# Patient Record
Sex: Female | Born: 1985 | Hispanic: No | Marital: Single | Smoking: Never smoker
Health system: Southern US, Community
[De-identification: ages and names within clinical notes are randomized; demographics above are authoritative.]

## PROBLEM LIST (undated history)

## (undated) DIAGNOSIS — D649 Anemia, unspecified: Secondary | ICD-10-CM

## (undated) DIAGNOSIS — B019 Varicella without complication: Secondary | ICD-10-CM

## (undated) DIAGNOSIS — J45909 Unspecified asthma, uncomplicated: Secondary | ICD-10-CM

## (undated) HISTORY — DX: Unspecified asthma, uncomplicated: J45.909

## (undated) HISTORY — DX: Varicella without complication: B01.9

## (undated) HISTORY — PX: NO PAST SURGERIES: SHX2092

---

## 2015-10-13 ENCOUNTER — Other Ambulatory Visit (HOSPITAL_COMMUNITY)
Admission: RE | Admit: 2015-10-13 | Discharge: 2015-10-13 | Disposition: A | Discharge status: Home / Self Care | Payer: BLUE CROSS/BLUE SHIELD | Source: Ambulatory Visit | Attending: Family Medicine | Admitting: Family Medicine

## 2015-10-13 ENCOUNTER — Ambulatory Visit: Payer: BLUE CROSS/BLUE SHIELD | Admitting: Nurse Practitioner

## 2015-10-13 ENCOUNTER — Ambulatory Visit (INDEPENDENT_AMBULATORY_CARE_PROVIDER_SITE_OTHER): Payer: BLUE CROSS/BLUE SHIELD | Admitting: Family Medicine

## 2015-10-13 ENCOUNTER — Encounter: Payer: Self-pay | Admitting: Family Medicine

## 2015-10-13 VITALS — BP 110/68 | HR 75 | Temp 98.6°F | Ht 66.0 in | Wt 189.6 lb

## 2015-10-13 DIAGNOSIS — Z01419 Encounter for gynecological examination (general) (routine) without abnormal findings: Secondary | ICD-10-CM | POA: Insufficient documentation

## 2015-10-13 DIAGNOSIS — R6889 Other general symptoms and signs: Secondary | ICD-10-CM

## 2015-10-13 DIAGNOSIS — Z13220 Encounter for screening for lipoid disorders: Secondary | ICD-10-CM | POA: Diagnosis not present

## 2015-10-13 DIAGNOSIS — Z124 Encounter for screening for malignant neoplasm of cervix: Secondary | ICD-10-CM

## 2015-10-13 DIAGNOSIS — Z0000 Encounter for general adult medical examination without abnormal findings: Secondary | ICD-10-CM

## 2015-10-13 DIAGNOSIS — M7552 Bursitis of left shoulder: Secondary | ICD-10-CM | POA: Insufficient documentation

## 2015-10-13 DIAGNOSIS — E669 Obesity, unspecified: Secondary | ICD-10-CM

## 2015-10-13 DIAGNOSIS — Z0001 Encounter for general adult medical examination with abnormal findings: Secondary | ICD-10-CM | POA: Insufficient documentation

## 2015-10-13 NOTE — Patient Instructions (Signed)
Nice to meet you. Please call your insurance company to see what the cost would be for a subacromial injection in her left shoulder. Please also see how much it would cost to be referred to sports medicine. You can continue to do as needed ibuprofen. We will check some lab at a future visit for screening. We will also call you with the results of your Pap smear.

## 2015-10-13 NOTE — Assessment & Plan Note (Addendum)
Pap smear performed today. Breast exam performed with no masses. Lab work as outlined below. Continue diet and exercise.

## 2015-10-13 NOTE — Assessment & Plan Note (Addendum)
Left shoulder bursitis likely diagnosis. Discussed ibuprofen treatment for this. Also discussed injection in the shoulder though patient wanted to check to see you on Price for this. Also discussed referral to sports medicine and she is going to check on cost of this as well. She will continue to monitor.

## 2015-10-13 NOTE — Progress Notes (Signed)
Patient ID: Katrina Curry, female   DOB: [birth year], 30 y.o.   MRN: [id number]  Tommi Rumps, MD Phone: [phone]  Katrina Curry Katrina Curry is a 30 y.o. female who presents today for new patient visit.  Left shoulder pain: Patient notes she has a history of bursitis. She has had intermittent chronic left shoulder discomfort over the last 5 years. Notes it hurts with particular movements particularly abduction and external rotation. She can only with her shoulder to about 60 before getting pain. She had an injection 2 years ago that was beneficial. She took ibuprofen earlier this week with some benefit. Cannot sleep on the left side due to discomfort.   She additionally presents for physical exam. She exercises by walking and doing core exercises 3 days a week. Diet is described as relatively healthy with shakes at lunch and eating salads and proteins. Last Pap smear was 5 years ago. Reports it was normal. She has menstrual periods monthly that last for 7 days. No history of breast lesions. Does not perform self breast exams regularly. No history of breast cancer in her family. Unsure of last Tdap.   Active Ambulatory Problems    Diagnosis Date Noted  . Bursitis of left shoulder 10/13/2015  . Encounter for general adult medical examination with abnormal findings 10/13/2015   Resolved Ambulatory Problems    Diagnosis Date Noted  . No Resolved Ambulatory Problems   Past Medical History  Diagnosis Date  . Asthma   . Chickenpox     Family History  Problem Relation Age of Onset  . Arthritis    . Lung cancer    . Stroke    . Hypertension    . Diabetes      Social History   Social History  . Marital Status: Single    Spouse Name: N/A  . Number of Children: N/A  . Years of Education: N/A   Occupational History  . Not on file.   Social History Main Topics  . Smoking status: Never Smoker   . Smokeless tobacco: Not on file  . Alcohol Use: No  . Drug Use: No  .  Sexual Activity: Not on file   Other Topics Concern  . Not on file   Social History Narrative  . No narrative on file    ROS  General:  Negative for nexplained weight loss, fever Skin: Negative for new or changing mole, sore that won't heal HEENT: Negative for trouble hearing, trouble seeing, ringing in ears, mouth sores, hoarseness, change in voice, dysphagia. CV:  Negative for chest pain, dyspnea, edema, palpitations Resp: Negative for cough, dyspnea, hemoptysis GI: Negative for nausea, vomiting, diarrhea, constipation, abdominal pain, melena, hematochezia. GU: Negative for dysuria, incontinence, urinary hesitance, hematuria, vaginal or penile discharge, polyuria, sexual difficulty, lumps in testicle or breasts MSK: Negative for muscle cramps or aches, joint pain or swelling Neuro: Negative for headaches, weakness, numbness, dizziness, passing out/fainting Psych: Negative for depression, anxiety, memory problems  Objective  Physical Exam Filed Vitals:   10/13/15 1517  BP: 110/68  Pulse: 75  Temp: 98.6 F (37 C)    BP Readings from Last 3 Encounters:  10/13/15 110/68   Wt Readings from Last 3 Encounters:  10/13/15 189 lb 9.6 oz (86.002 kg)    Physical Exam  Constitutional: She is well-developed, well-nourished, and in no distress.  HENT:  Head: Normocephalic and atraumatic.  Right Ear: External ear normal.  Left Ear: External ear normal.  Mouth/Throat: Oropharynx is clear  and moist.  Eyes: Conjunctivae are normal. Pupils are equal, round, and reactive to light.  Neck: Neck supple.  Cardiovascular: Normal rate, regular rhythm and normal heart sounds.   Pulmonary/Chest: Effort normal and breath sounds normal.  Bilateral breasts with no masses or skin changes  Abdominal: Soft. Bowel sounds are normal. She exhibits no distension. There is no tenderness. There is no rebound and no guarding.  Genitourinary: Vagina normal, uterus normal, cervix normal, right adnexa  normal and left adnexa normal. No vaginal discharge found.  Musculoskeletal:  Mild anterior left shoulder tenderness to palpation, no other tenderness palpation of shoulder, shoulders appear symmetric, decreased active range of motion relating to pain on external rotation and abduction, normal internal rotation actively, full range of motion passively in left shoulder, positive empty can, negative speeds Right shoulder with no tenderness, full range of motion actively and passively with no discomfort, negative empty can and speeds Bilateral hands are warm and well-perfused  Lymphadenopathy:    She has no cervical adenopathy.  Neurological:  5 out of 5 strength bilateral biceps, triceps, and grip, sensation to light touch intact in bilateral upper extremities  Skin: Skin is warm and dry. She is not diaphoretic.  Psychiatric: Mood and affect normal.     Assessment/Plan:   Bursitis of left shoulder Left shoulder bursitis likely diagnosis. Discussed ibuprofen treatment for this. Also discussed injection in the shoulder though patient wanted to check to see you on Price for this. Also discussed referral to sports medicine and she is going to check on cost of this as well. She will continue to monitor.  Encounter for general adult medical examination with abnormal findings Pap smear performed today. Breast exam performed with no masses. Lab work as outlined below. Continue diet and exercise.    Orders Placed This Encounter  Procedures  . Lipid Profile    Standing Status: Future     Number of Occurrences:      Standing Expiration Date: 10/12/2016  . Comp Met (CMET)    Standing Status: Future     Number of Occurrences:      Standing Expiration Date: 10/12/2016  . HgB A1c    Standing Status: Future     Number of Occurrences:      Standing Expiration Date: 10/12/2016    Tommi Rumps, MD [place]

## 2015-10-17 ENCOUNTER — Telehealth: Payer: Self-pay | Admitting: Family Medicine

## 2015-10-17 LAB — CYTOLOGY - PAP

## 2015-10-17 NOTE — Telephone Encounter (Signed)
Do you know what codes you will use so I can give to patient

## 2015-10-17 NOTE — Telephone Encounter (Signed)
The patient called needing a diagnosis code ,procedure code and place of service regarding her cortisone injection. Her insurance carrier is needing this information to give her a cost of how much they will cover.

## 2015-10-19 NOTE — Telephone Encounter (Signed)
Patient requested a follow up on code

## 2015-10-19 NOTE — Telephone Encounter (Signed)
Please advise, I don't know the answer to this? Thanks

## 2015-10-19 NOTE — Telephone Encounter (Signed)
Diagnosis is bursitis of left shoulder, code is M75.52. Place of service would be our office.

## 2015-10-19 NOTE — Telephone Encounter (Signed)
Spoke with the patient, gave her the information, thanks

## 2015-10-24 ENCOUNTER — Encounter: Payer: Self-pay | Admitting: Family Medicine

## 2016-01-27 ENCOUNTER — Emergency Department
Admission: EM | Admit: 2016-01-27 | Discharge: 2016-01-27 | Disposition: A | Discharge status: Home / Self Care | Payer: BLUE CROSS/BLUE SHIELD | Attending: Emergency Medicine | Admitting: Emergency Medicine

## 2016-01-27 ENCOUNTER — Encounter: Payer: Self-pay | Admitting: Emergency Medicine

## 2016-01-27 ENCOUNTER — Emergency Department: Payer: BLUE CROSS/BLUE SHIELD

## 2016-01-27 DIAGNOSIS — J45909 Unspecified asthma, uncomplicated: Secondary | ICD-10-CM | POA: Diagnosis not present

## 2016-01-27 DIAGNOSIS — R0789 Other chest pain: Secondary | ICD-10-CM | POA: Insufficient documentation

## 2016-01-27 DIAGNOSIS — S161XXA Strain of muscle, fascia and tendon at neck level, initial encounter: Secondary | ICD-10-CM | POA: Diagnosis not present

## 2016-01-27 DIAGNOSIS — Y9389 Activity, other specified: Secondary | ICD-10-CM | POA: Diagnosis not present

## 2016-01-27 DIAGNOSIS — S199XXA Unspecified injury of neck, initial encounter: Secondary | ICD-10-CM | POA: Diagnosis present

## 2016-01-27 DIAGNOSIS — Y92410 Unspecified street and highway as the place of occurrence of the external cause: Secondary | ICD-10-CM | POA: Diagnosis not present

## 2016-01-27 DIAGNOSIS — S40021A Contusion of right upper arm, initial encounter: Secondary | ICD-10-CM | POA: Diagnosis not present

## 2016-01-27 DIAGNOSIS — Y999 Unspecified external cause status: Secondary | ICD-10-CM | POA: Diagnosis not present

## 2016-01-27 MED ORDER — HYDROCODONE-ACETAMINOPHEN 5-325 MG PO TABS
1.0000 | ORAL_TABLET | Freq: Once | ORAL | Status: AC
  Administered 2016-01-27: 1 via ORAL
  Filled 2016-01-27: qty 1

## 2016-01-27 MED ORDER — HYDROCODONE-ACETAMINOPHEN 5-325 MG PO TABS: 1.0000 | ORAL_TABLET | ORAL | 0 refills | Status: DC | PRN

## 2016-01-27 NOTE — Discharge Instructions (Signed)
Ibuprofen or Aleve over-the-counter as directed. Take Norco as needed for severe pain. Use ice to your muscles as needed for pain. [facility] if any continued problems. Soreness even with medication is expected to last 4-5 days.
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
## 2016-01-29 ENCOUNTER — Ambulatory Visit: Payer: BLUE CROSS/BLUE SHIELD | Admitting: Family Medicine

## 2016-02-05 ENCOUNTER — Emergency Department
Admission: EM | Admit: 2016-02-05 | Discharge: 2016-02-05 | Disposition: A | Discharge status: Home / Self Care | Payer: BLUE CROSS/BLUE SHIELD | Attending: Emergency Medicine | Admitting: Emergency Medicine

## 2016-02-05 ENCOUNTER — Other Ambulatory Visit: Payer: Self-pay | Admitting: Chiropractor

## 2016-02-05 ENCOUNTER — Encounter: Payer: Self-pay | Admitting: Emergency Medicine

## 2016-02-05 ENCOUNTER — Ambulatory Visit
Admission: RE | Admit: 2016-02-05 | Discharge: 2016-02-05 | Disposition: A | Discharge status: Home / Self Care | Payer: BLUE CROSS/BLUE SHIELD | Source: Ambulatory Visit | Attending: Chiropractor | Admitting: Chiropractor

## 2016-02-05 DIAGNOSIS — Y939 Activity, unspecified: Secondary | ICD-10-CM | POA: Diagnosis not present

## 2016-02-05 DIAGNOSIS — J45909 Unspecified asthma, uncomplicated: Secondary | ICD-10-CM | POA: Insufficient documentation

## 2016-02-05 DIAGNOSIS — Y92410 Unspecified street and highway as the place of occurrence of the external cause: Secondary | ICD-10-CM | POA: Diagnosis not present

## 2016-02-05 DIAGNOSIS — S161XXA Strain of muscle, fascia and tendon at neck level, initial encounter: Secondary | ICD-10-CM

## 2016-02-05 DIAGNOSIS — R42 Dizziness and giddiness: Secondary | ICD-10-CM

## 2016-02-05 DIAGNOSIS — Y999 Unspecified external cause status: Secondary | ICD-10-CM | POA: Diagnosis not present

## 2016-02-05 DIAGNOSIS — T1490XA Injury, unspecified, initial encounter: Secondary | ICD-10-CM

## 2016-02-05 DIAGNOSIS — S199XXA Unspecified injury of neck, initial encounter: Secondary | ICD-10-CM | POA: Diagnosis present

## 2016-02-05 MED ORDER — METHOCARBAMOL 750 MG PO TABS: 750.0000 mg | ORAL_TABLET | Freq: Four times a day (QID) | ORAL | 0 refills | Status: DC

## 2016-02-05 MED ORDER — DIAZEPAM 5 MG/ML IJ SOLN
5.0000 mg | Freq: Once | INTRAMUSCULAR | Status: AC
  Administered 2016-02-05: 5 mg via INTRAMUSCULAR
  Filled 2016-02-05: qty 2

## 2016-02-05 MED ORDER — BUTALBITAL-APAP-CAFFEINE 50-325-40 MG PO TABS: 1.0000 | ORAL_TABLET | Freq: Four times a day (QID) | ORAL | 0 refills | Status: DC | PRN

## 2016-02-05 MED ORDER — NAPROXEN 500 MG PO TABS: 500.0000 mg | ORAL_TABLET | Freq: Two times a day (BID) | ORAL | 0 refills | Status: DC

## 2016-02-05 NOTE — Discharge Instructions (Signed)
Use heating pad as needed for comfort and pain as well.
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
## 2016-10-07 ENCOUNTER — Telehealth: Payer: Self-pay | Admitting: Family Medicine

## 2016-10-07 ENCOUNTER — Other Ambulatory Visit: Payer: Self-pay | Admitting: Family Medicine

## 2016-10-07 MED ORDER — NORETHIN ACE-ETH ESTRAD-FE 1.5-30 MG-MCG PO TABS: 1.0000 | ORAL_TABLET | Freq: Every day | ORAL | 11 refills | Status: DC

## 2016-10-07 NOTE — Telephone Encounter (Signed)
Rx sent 

## 2016-10-07 NOTE — Telephone Encounter (Signed)
Patient notified and voiced understanding.

## 2016-10-07 NOTE — Telephone Encounter (Signed)
Pt called about wanting to know if she can get a Rx for birth control to help with her menstrual cramps? Please advise? Spoke with patient states she has swelling in right knee and both knees at time of menstrual cycle.  Has cramps in stomach and applies ice to tummy and at times has to lay on tummy to relieve pressure.   Describes her menstrual as flow as heavy from Day 1 -Day 7.    She used to be [place] birth [place] in [place] ? Name [place] .   LMP 09/19/16.    Call pt @ [phone]. Thank you! No available appointments for follow up. Please advise.    [facility] [address] - , [place] - [id number]

## 2016-10-07 NOTE — Telephone Encounter (Signed)
Pt called about wanting to know if she can get a Rx for birth control to help with her menstrual cramps? Please advise?  Call pt @ [phone]. Thank you!  Pharmacy is [facility] [address] - [place], [place] - [phone]

## 2016-10-14 ENCOUNTER — Encounter: Payer: Self-pay | Admitting: Family

## 2016-10-14 ENCOUNTER — Ambulatory Visit (INDEPENDENT_AMBULATORY_CARE_PROVIDER_SITE_OTHER): Payer: BLUE CROSS/BLUE SHIELD | Admitting: Family

## 2016-10-14 VITALS — BP 112/60 | HR 67 | Temp 99.1°F | Ht 66.0 in | Wt 183.8 lb

## 2016-10-14 DIAGNOSIS — G4700 Insomnia, unspecified: Secondary | ICD-10-CM

## 2016-10-14 DIAGNOSIS — N644 Mastodynia: Secondary | ICD-10-CM

## 2016-10-14 NOTE — Progress Notes (Signed)
Pre visit review using our clinic review tool, if applicable. No additional management support is needed unless otherwise documented below in the visit note. 

## 2016-10-14 NOTE — Patient Instructions (Signed)
Suspect breast pain may be from weight loss  As discussed , if doesn't improve let me know and we will pursue ultrasound  Trial of melatonin and sleep hygiene practices as discussed  If not better let me know if not better.

## 2016-10-14 NOTE — Progress Notes (Signed)
Subjective:    Patient ID: Katrina Curry, female    DOB: 06/01/1986, 31 y.o.   MRN: [id number]  CC: Katrina BeckmannDebra Kay Scott Curry is a 31 y.o. female who presents today for an acute visit.    HPI: CC: breast pain bilateral for 2 weeks, unchanged.   No concerns for pregnancy. Not effected by menstrual period. No nipple discharge, breast skin changes. NO  Fever. No caffeine.   Has never had a mammogram  No family h/o breast cancer  Notes being on diet and lost 24 pounds and breasts smaller since weight loss.   Has trouble sleeping, months, unchanged. Goes to bed at 11- wakes up at 2 am, and cannot go back to sleep. No screen use prior to bed.        HISTORY:  Past Medical History:  Diagnosis Date  . Asthma   . Chickenpox    No past surgical history on file. Family History  Problem Relation Age of Onset  . Arthritis Unknown   . Lung cancer Unknown   . Stroke Unknown   . Hypertension Unknown   . Diabetes Unknown     Allergies: Pollen extract Current Outpatient Prescriptions on File Prior to Visit  Medication Sig Dispense Refill  . butalbital-acetaminophen-caffeine (FIORICET) 50-325-40 MG tablet Take 1-2 tablets by mouth every 6 (six) hours as needed for headache. 20 tablet 0  . methocarbamol (ROBAXIN) 750 MG tablet Take 1 tablet (750 mg total) by mouth 4 (four) times daily. 40 tablet 0  . naproxen (NAPROSYN) 500 MG tablet Take 1 tablet (500 mg total) by mouth 2 (two) times daily with a meal. 60 tablet 0  . norethindrone-ethinyl estradiol-iron (MICROGESTIN FE,GILDESS FE,LOESTRIN FE) 1.5-30 MG-MCG tablet Take 1 tablet by mouth daily. 1 Package 11   No current facility-administered medications on file prior to visit.     Social History  Substance Use Topics  . Smoking status: Never Smoker  . Smokeless tobacco: Never Used  . Alcohol use No    Review of Systems  Constitutional: Negative for chills and fever.  Respiratory: Negative for cough.   Cardiovascular:  Negative for chest pain and palpitations.  Gastrointestinal: Negative for nausea and vomiting.      Objective:    BP 112/60   Pulse 67   Temp 99.1 F (37.3 C) (Oral)   Ht 5\' 6"  (1.676 m)   Wt 183 lb 12.8 oz (83.4 kg)   SpO2 96%   BMI 29.67 kg/m    Physical Exam  Constitutional: She appears well-developed and well-nourished.  Eyes: Conjunctivae are normal.  Neck: No thyroid mass and no thyromegaly present.  Cardiovascular: Normal rate, regular rhythm, normal heart sounds and normal pulses.   Pulmonary/Chest: Effort normal and breath sounds normal. She has no wheezes. She has no rhonchi. She has no rales. Right breast exhibits no inverted nipple, no mass, no nipple discharge, no skin change and no tenderness. Left breast exhibits no inverted nipple, no mass, no nipple discharge, no skin change and no tenderness. Breasts are symmetrical.  CBE performed. No tenderness appreciated on exam.   Lymphadenopathy:       Head (right side): No submental, no submandibular, no tonsillar, no preauricular, no posterior auricular and no occipital adenopathy present.       Head (left side): No submental, no submandibular, no tonsillar, no preauricular, no posterior auricular and no occipital adenopathy present.    She has no cervical adenopathy.       Right cervical:  No superficial cervical, no deep cervical and no posterior cervical adenopathy present.      Left cervical: No superficial cervical, no deep cervical and no posterior cervical adenopathy present.    She has no axillary adenopathy.  Neurological: She is alert.  Skin: Skin is warm and dry.  Psychiatric: She has a normal mood and affect. Her speech is normal and behavior is normal. Thought content normal.  Vitals reviewed.      Assessment & Plan:   Problem List Items Addressed This Visit      Other   Breast tenderness - Primary    Reassured the tenderness is bilateral. No caffeine use. Suspect change in weight may be contributory.   Patient and I discussed pursuing imaging today, since symptoms  started 2 weeks ago, she would like to give it more time which I think is reasonable. She will let me know if symptoms are not resolve in the next couple weeks and we will pursue diagnostic imaging at that time.      Insomnia    Discussed sleep hygiene at great length. Patient has trouble staying asleep. We discussed a trial of melatonin, increasing exercise. Patient will follow-up with myself or PCP if no improvement.           I am having Ms. Aliene Altes maintain her naproxen, butalbital-acetaminophen-caffeine, methocarbamol, and norethindrone-ethinyl estradiol-iron.   No orders of the defined types were placed in this encounter.   Return precautions given.   Risks, benefits, and alternatives of the medications and treatment plan prescribed today were discussed, and patient expressed understanding.   Education regarding symptom management and diagnosis given to patient on AVS.  Continue to follow with Glori Luis, MD for routine health maintenance.   Katrina Curry and I agreed with plan.   Rennie Plowman, FNP

## 2016-10-15 DIAGNOSIS — N644 Mastodynia: Secondary | ICD-10-CM | POA: Insufficient documentation

## 2016-10-15 DIAGNOSIS — G4700 Insomnia, unspecified: Secondary | ICD-10-CM | POA: Insufficient documentation

## 2016-10-15 NOTE — Assessment & Plan Note (Addendum)
Reassured the tenderness is bilateral. No caffeine use. Suspect change in weight may be contributory.  Patient and I discussed pursuing imaging today, since symptoms  started 2 weeks ago, she would like to give it more time which I think is reasonable. She will let me know if symptoms are not resolve in the next couple weeks and we will pursue diagnostic imaging at that time.

## 2016-10-15 NOTE — Assessment & Plan Note (Signed)
Discussed sleep hygiene at great length. Patient has trouble staying asleep. We discussed a trial of melatonin, increasing exercise. Patient will follow-up with myself or PCP if no improvement.

## 2016-12-30 ENCOUNTER — Ambulatory Visit (INDEPENDENT_AMBULATORY_CARE_PROVIDER_SITE_OTHER): Payer: BLUE CROSS/BLUE SHIELD | Admitting: Family Medicine

## 2016-12-30 ENCOUNTER — Encounter: Payer: Self-pay | Admitting: Family Medicine

## 2016-12-30 ENCOUNTER — Ambulatory Visit (INDEPENDENT_AMBULATORY_CARE_PROVIDER_SITE_OTHER): Payer: BLUE CROSS/BLUE SHIELD

## 2016-12-30 VITALS — BP 100/66 | HR 80 | Temp 99.2°F | Resp 16 | Ht 65.5 in | Wt 182.8 lb

## 2016-12-30 DIAGNOSIS — G8929 Other chronic pain: Secondary | ICD-10-CM

## 2016-12-30 DIAGNOSIS — M25531 Pain in right wrist: Secondary | ICD-10-CM

## 2016-12-30 DIAGNOSIS — M5441 Lumbago with sciatica, right side: Secondary | ICD-10-CM

## 2016-12-30 NOTE — Assessment & Plan Note (Signed)
Potentially could be soft tissue injury. Somewhat concerning given tenderness over the anatomic snuffbox. We'll obtain an x-ray. She was given a prescription for a spica thumb splint. Ad[facility] to [facility] today so that she can get in it. She'll wear this is much as possible. We'll refer to orthopedics.
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
## 2016-12-30 NOTE — Patient Instructions (Signed)
Nice to see you. Please go get the splint today so that you can wear this until you see orthopedics. We'll call you with the results of your x-rays. If you develop numbness, weakness, loss of bowel or bladder function, numbness between her legs, or any new or changing symptoms please seek medical attention medially.  [facility].
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
## 2016-12-31 ENCOUNTER — Other Ambulatory Visit: Payer: Self-pay | Admitting: Family Medicine

## 2016-12-31 DIAGNOSIS — G8929 Other chronic pain: Secondary | ICD-10-CM

## 2016-12-31 DIAGNOSIS — M545 Low back pain: Principal | ICD-10-CM

## 2017-01-02 ENCOUNTER — Telehealth: Payer: Self-pay | Admitting: Family Medicine

## 2017-01-02 NOTE — Telephone Encounter (Signed)
Pt needs a copy of her xray of her back and wrist.   Call pt @ [phone]

## 2017-01-03 NOTE — Telephone Encounter (Signed)
Having issues with copying xrays on CD's, please inform pt that you will call her when its ready. Thank you.

## 2017-01-16 ENCOUNTER — Telehealth: Payer: Self-pay | Admitting: Radiology

## 2017-01-16 NOTE — Telephone Encounter (Signed)
Called pt regarding getting a copy of Xray. Left message for patient to return call.

## 2017-01-16 NOTE — Telephone Encounter (Signed)
Patient notified

## 2017-01-16 NOTE — Telephone Encounter (Signed)
PT called back and advised pt that her xray CD was made and put front for her to pick up.

## 2017-01-16 NOTE — Telephone Encounter (Signed)
Pt called and left a message on office voice mail. She is still waiting for her x-rays.

## 2017-01-21 ENCOUNTER — Ambulatory Visit: Payer: BLUE CROSS/BLUE SHIELD | Attending: Family Medicine

## 2017-01-21 DIAGNOSIS — M6281 Muscle weakness (generalized): Secondary | ICD-10-CM

## 2017-01-21 DIAGNOSIS — M545 Low back pain: Secondary | ICD-10-CM | POA: Diagnosis present

## 2017-01-21 DIAGNOSIS — G8929 Other chronic pain: Secondary | ICD-10-CM

## 2017-01-21 NOTE — Patient Instructions (Signed)
   Transversus abdominis contraction.    Pretend that there is a string attached from one side of your pelvis to the other.    Tighten that "string." (pull your belly button in)   Hold for 5 seconds.   Perform throughout the day.
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
## 2017-01-23 ENCOUNTER — Ambulatory Visit: Payer: BLUE CROSS/BLUE SHIELD

## 2017-01-23 DIAGNOSIS — M545 Low back pain: Principal | ICD-10-CM

## 2017-01-23 DIAGNOSIS — G8929 Other chronic pain: Secondary | ICD-10-CM

## 2017-01-23 DIAGNOSIS — M6281 Muscle weakness (generalized): Secondary | ICD-10-CM

## 2017-01-23 NOTE — Patient Instructions (Addendum)
   On your back in the reclined position and both knees bent comfortably:   Gently squeeze a ball between your knees as your gently activate your abdominal and rear-end muscles.    Hold for 2 seconds.    Repeat 10 times    Perform 3 sets per day.      Reviewed and given supine hip fallouts in a pain-free range as part of her HEP. Pt demonstrated and verbalized understanding.
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
## 2017-01-29 ENCOUNTER — Ambulatory Visit: Payer: BLUE CROSS/BLUE SHIELD

## 2017-01-29 DIAGNOSIS — M545 Low back pain: Principal | ICD-10-CM

## 2017-01-29 DIAGNOSIS — M6281 Muscle weakness (generalized): Secondary | ICD-10-CM

## 2017-01-29 DIAGNOSIS — G8929 Other chronic pain: Secondary | ICD-10-CM
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
## 2017-02-05 ENCOUNTER — Ambulatory Visit: Payer: BLUE CROSS/BLUE SHIELD | Attending: Family Medicine

## 2017-02-05 DIAGNOSIS — M6281 Muscle weakness (generalized): Secondary | ICD-10-CM | POA: Insufficient documentation

## 2017-02-05 DIAGNOSIS — M545 Low back pain: Secondary | ICD-10-CM | POA: Insufficient documentation

## 2017-02-05 DIAGNOSIS — G8929 Other chronic pain: Secondary | ICD-10-CM | POA: Insufficient documentation
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
## 2017-02-12 ENCOUNTER — Ambulatory Visit: Payer: BLUE CROSS/BLUE SHIELD

## 2017-02-12 DIAGNOSIS — M6281 Muscle weakness (generalized): Secondary | ICD-10-CM

## 2017-02-12 DIAGNOSIS — M545 Low back pain: Secondary | ICD-10-CM | POA: Diagnosis not present

## 2017-02-12 DIAGNOSIS — G8929 Other chronic pain: Secondary | ICD-10-CM
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
## 2017-02-18 ENCOUNTER — Ambulatory Visit: Payer: BLUE CROSS/BLUE SHIELD

## 2017-02-18 DIAGNOSIS — G8929 Other chronic pain: Secondary | ICD-10-CM

## 2017-02-18 DIAGNOSIS — M545 Low back pain: Secondary | ICD-10-CM | POA: Diagnosis not present

## 2017-02-18 DIAGNOSIS — M6281 Muscle weakness (generalized): Secondary | ICD-10-CM
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
## 2017-02-20 ENCOUNTER — Ambulatory Visit: Payer: BLUE CROSS/BLUE SHIELD

## 2017-02-20 DIAGNOSIS — M6281 Muscle weakness (generalized): Secondary | ICD-10-CM

## 2017-02-20 DIAGNOSIS — M545 Low back pain: Secondary | ICD-10-CM | POA: Diagnosis not present

## 2017-02-20 DIAGNOSIS — G8929 Other chronic pain: Secondary | ICD-10-CM
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
## 2017-06-09 ENCOUNTER — Ambulatory Visit: Payer: Self-pay | Admitting: *Deleted

## 2017-06-09 NOTE — Telephone Encounter (Signed)
Please follow up with the patient to ensure she got evaluated. Thanks.  

## 2017-06-09 NOTE — Telephone Encounter (Signed)
   Answer Assessment - Initial Assessment Questions 1. DESCRIPTION: "Describe your dizziness."     "Room spinning, bed moving" 2. VERTIGO: "Do you feel like either you or the room is spinning or tilting?"      Yes 3. LIGHTHEADED: "Do you feel lightheaded?" (e.g., somewhat faint, woozy, weak upon standing)     "When I walk I have to hold on to the wall." 4. SEVERITY: "How bad is it?"  "Can you walk?"   - MILD - Feels unsteady but walking normally.   - MODERATE - Feels very unsteady when walking, but not falling; interferes with normal activities (e.g., school, work) .   - SEVERE - Unable to walk without falling (requires assistance).    Severe 5. ONSET:  "When did the dizziness begin?"     4 AM this morning 6. AGGRAVATING FACTORS: "Does anything make it worse?" (e.g., standing, change in head position)     Positional changes 7. CAUSE: "What do you think is causing the dizziness?"     Unsure 8. RECURRENT SYMPTOM: "Have you had dizziness before?" If so, ask: "When was the last time?" "What happened that time?"     Years ago. Not sure what it was 9. OTHER SYMPTOMS: "Do you have any other symptoms?" (e.g., headache, weakness, numbness, vomiting, earache)     Headache since Thursday 06/05/17. 8/10 Ibuprofen effective  10. PREGNANCY: "Is there any chance you are pregnant?" "When was your last menstrual period?"       [facility]  Protocols used: DIZZINESS - VERTIGO-A-AH

## 2017-06-09 NOTE — Telephone Encounter (Signed)
Pt reports severe dizziness, onset 4am this morning. Room spinning, "feels like bed moving." " Falling into walls." States she has to hold on to the walls and furniture to walk. Worse with positional changes but always present. Denies any head injury, earache, congestion. No SOB, CP.  Reports headache since last Thursday 8/10, has been taking 800 mg of Ibuprofen, "little help."  ALso reports her menstrual cycle longer than usual "10 days" which ended Friday 06/06/17. Instructed to go to ED.  Friend will drive her to [facility]. Reason for Disposition . SEVERE dizziness (vertigo) (e.g., unable to walk without assistance)  Protocols used: DIZZINESS - VERTIGO-A-AH

## 2017-06-10 NOTE — Telephone Encounter (Signed)
Noted.  If her symptoms do not improve she needs to let us know.  Thanks.

## 2017-06-10 NOTE — Telephone Encounter (Signed)
[facility]

## 2017-11-10 ENCOUNTER — Ambulatory Visit: Payer: Self-pay | Admitting: *Deleted

## 2017-11-10 NOTE — Telephone Encounter (Signed)
Pt reports constant vertigo x 3 days. "Room spinning." States is always present. Also reports blurred vision, continuous. Headache 6/10. States she has to hold onto things to ambulate around home. [facility]. [facility] for similar symptoms Dx. 'Labyrinthitis.' Pt states "This feels different.'  Reports B/P checked at pharmacy yesterday...93/66. Directed pt to ED. Family will drive.  Reason for Disposition . SEVERE dizziness (vertigo) (e.g., unable to walk without assistance)    Moderate but constant vertigo x 3 days, blurred vision. Has to hold onto things to ambulate.  Answer Assessment - Initial Assessment Questions 1. DESCRIPTION: "Describe your dizziness."     vertigo 2. VERTIGO: "Do you feel like either you or the room is spinning or tilting?"      yes 3. LIGHTHEADED: "Do you feel lightheaded?" (e.g., somewhat faint, woozy, weak upon standing)     yes 4. SEVERITY: "How bad is it?"  "Can you walk?"   - MILD - Feels unsteady but walking normally.   - MODERATE - Feels very unsteady when walking, but not falling; interferes with normal activities (e.g., school, work) .   - SEVERE - Unable to walk without falling (requires assistance).     Moderate but constant x 3 days. 5. ONSET:  "When did the dizziness begin?"     Saturday 6. AGGRAVATING FACTORS: "Does anything make it worse?" (e.g., standing, change in head position)     BEnding over 7. CAUSE: "What do you think is causing the dizziness?"     unsure 8. RECURRENT SYMPTOM: "Have you had dizziness before?" If so, ask: "When was the last time?" "What happened that time?"     JAnuary 2019; Labyrinthitis 9. OTHER SYMPTOMS: "Do you have any other symptoms?" (e.g., headache, weakness, numbness, vomiting, earache)     Headache, 6/10; vision blurry, constant. 10. PREGNANCY: "Is there any chance you are pregnant?" "When was your last menstrual period?"       no  Protocols used: DIZZINESS - VERTIGO-A-AH

## 2017-11-10 NOTE — Telephone Encounter (Signed)
Agree with ED evaluation for persistent vertigo.

## 2017-11-10 NOTE — Telephone Encounter (Signed)
fyi

## 2017-11-13 ENCOUNTER — Other Ambulatory Visit: Payer: Self-pay

## 2017-11-13 ENCOUNTER — Emergency Department
Admission: EM | Admit: 2017-11-13 | Discharge: 2017-11-13 | Disposition: A | Discharge status: Home / Self Care | Payer: BLUE CROSS/BLUE SHIELD | Attending: Emergency Medicine | Admitting: Emergency Medicine

## 2017-11-13 ENCOUNTER — Encounter: Payer: Self-pay | Admitting: Emergency Medicine

## 2017-11-13 DIAGNOSIS — J45909 Unspecified asthma, uncomplicated: Secondary | ICD-10-CM | POA: Insufficient documentation

## 2017-11-13 DIAGNOSIS — H8303 Labyrinthitis, bilateral: Secondary | ICD-10-CM | POA: Insufficient documentation

## 2017-11-13 DIAGNOSIS — H8309 Labyrinthitis, unspecified ear: Secondary | ICD-10-CM

## 2017-11-13 DIAGNOSIS — R42 Dizziness and giddiness: Secondary | ICD-10-CM | POA: Diagnosis present

## 2017-11-13 DIAGNOSIS — Z79899 Other long term (current) drug therapy: Secondary | ICD-10-CM | POA: Diagnosis not present

## 2017-11-13 LAB — BASIC METABOLIC PANEL
Anion gap: 9 (ref 5–15)
BUN: 10 mg/dL (ref 6–20)
CALCIUM: 9.2 mg/dL (ref 8.9–10.3)
CO2: 27 mmol/L (ref 22–32)
Chloride: 102 mmol/L (ref 101–111)
Creatinine, Ser: 0.87 mg/dL (ref 0.44–1.00)
GFR calc Af Amer: >60 mL/min (ref >60)
GLUCOSE: 77 mg/dL (ref 65–99)
Potassium: 3.5 mmol/L (ref 3.5–5.1)
SODIUM: 138 mmol/L (ref 135–145)

## 2017-11-13 LAB — CBC
HEMATOCRIT: 41.2 % (ref 35.0–47.0)
Hemoglobin: 13.5 g/dL (ref 12.0–16.0)
MCH: 29.1 pg (ref 26.0–34.0)
MCHC: 32.8 g/dL (ref 32.0–36.0)
MCV: 88.8 fL (ref 80.0–100.0)
PLATELETS: 269 10*3/uL (ref 150–440)
RBC: 4.64 MIL/uL (ref 3.80–5.20)
RDW: 12.8 % (ref 11.5–14.5)
WBC: 5.9 10*3/uL (ref 3.6–11.0)

## 2017-11-13 LAB — URINALYSIS, COMPLETE (UACMP) WITH MICROSCOPIC
BACTERIA UA: NONE SEEN
BILIRUBIN URINE: NEGATIVE
GLUCOSE, UA: NEGATIVE mg/dL
HGB URINE DIPSTICK: NEGATIVE
Ketones, ur: NEGATIVE mg/dL
LEUKOCYTES UA: NEGATIVE
Nitrite: NEGATIVE
PH: 6 (ref 5.0–8.0)
Protein, ur: NEGATIVE mg/dL
Specific Gravity, Urine: 1.019 (ref 1.005–1.030)

## 2017-11-13 LAB — POCT PREGNANCY, URINE: Preg Test, Ur: NEGATIVE

## 2017-11-13 LAB — TROPONIN I

## 2017-11-13 MED ORDER — MECLIZINE HCL 25 MG PO TABS: 25.0000 mg | ORAL_TABLET | Freq: Three times a day (TID) | ORAL | 0 refills | Status: DC | PRN

## 2017-11-13 MED ORDER — PREDNISONE 10 MG PO TABS: ORAL_TABLET | ORAL | 0 refills | Status: DC

## 2017-11-13 MED ORDER — PREDNISONE 20 MG PO TABS: 60.0000 mg | ORAL_TABLET | Freq: Every day | ORAL | 0 refills | Status: DC

## 2017-11-13 MED ORDER — KETOROLAC TROMETHAMINE 30 MG/ML IJ SOLN
15.0000 mg | Freq: Once | INTRAMUSCULAR | Status: AC
  Administered 2017-11-13: 15 mg via INTRAVENOUS
  Filled 2017-11-13: qty 1

## 2017-11-13 MED ORDER — SODIUM CHLORIDE 0.9 % IV BOLUS
1000.0000 mL | Freq: Once | INTRAVENOUS | Status: AC
  Administered 2017-11-13: 1000 mL via INTRAVENOUS

## 2017-11-13 MED ORDER — MECLIZINE HCL 25 MG PO TABS
25.0000 mg | ORAL_TABLET | Freq: Once | ORAL | Status: AC
  Administered 2017-11-13: 25 mg via ORAL
  Filled 2017-11-13 (×2): qty 1

## 2017-11-13 MED ORDER — SODIUM CHLORIDE 0.9 % IV BOLUS: 1000.0000 mL | Freq: Once | INTRAVENOUS | Status: DC

## 2017-11-13 MED ORDER — DIPHENHYDRAMINE HCL 50 MG/ML IJ SOLN
12.5000 mg | Freq: Once | INTRAMUSCULAR | Status: AC
  Administered 2017-11-13: 12.5 mg via INTRAVENOUS
  Filled 2017-11-13: qty 1

## 2017-11-13 MED ORDER — METOCLOPRAMIDE HCL 5 MG/ML IJ SOLN
10.0000 mg | Freq: Once | INTRAMUSCULAR | Status: AC
  Administered 2017-11-13: 10 mg via INTRAVENOUS
  Filled 2017-11-13: qty 2

## 2017-11-13 MED ORDER — ONDANSETRON HCL 4 MG/2ML IJ SOLN
4.0000 mg | Freq: Once | INTRAMUSCULAR | Status: DC
  Filled 2017-11-13: qty 2

## 2017-11-13 NOTE — ED Triage Notes (Signed)
Pt to triage via w/c with no distress noted; reports awoke Saturday with dizziness; BP on Sun was 93/60; cont to have dizziness with frontal HA; denies hx of same

## 2017-11-13 NOTE — ED Provider Notes (Signed)
[facility] Emergency Department Provider Note  ____________________________________________  Time seen: Approximately 9:35 PM  I have reviewed the triage vital signs and the nursing notes.   HISTORY  Chief Complaint Dizziness   HPI Katrina BeckmannDebra Kay Scott Curry is a 32 y.o. female with a history of labyrinthitis who presents for evaluation of vertigo.  Patient reports that her symptoms started 5 days ago.  She is complaining of ringing bilateral ears, vertigo that happens when she moves her head and resolves at rest.  No hearing loss.  She also has had a dull headache that is located on the top of her head every evening for the last 5 days.  The headache is moderate in intensity.  Patient has tried 1 meclizine which helped however she was afraid of self diagnosing and stopped taking it at home.  She denies dysarthria, diplopia, dysphasia, slurred speech, facial droop, changes in vision, unilateral weakness or numbness.  She is not a smoker.  No personal or family history of stroke.  No fever or chills.  No neck stiffness.  No thunderclap headache. No ASA intake.   Past Medical History:  Diagnosis Date  . Asthma   . Chickenpox     Patient Active Problem List   Diagnosis Date Noted  . Wrist pain, right 12/30/2016  . Chronic bilateral low back pain with right-sided sciatica 12/30/2016  . Breast tenderness 10/15/2016  . Insomnia 10/15/2016  . Bursitis of left shoulder 10/13/2015  . Encounter for general adult medical examination with abnormal findings 10/13/2015    History reviewed. No pertinent surgical history.  Prior to Admission medications   Medication Sig Start Date End Date Taking? Authorizing Provider  cyclobenzaprine (FLEXERIL) 10 MG tablet Take 10 mg by mouth.    [provider]  meclizine (ANTIVERT) 25 MG tablet Take 1 tablet (25 mg total) by mouth 3 (three) times daily as needed for dizziness. 11/13/17   Nita SickleVeronese, [place], MD  MELATONIN PO  Take by mouth.    [provider]  meloxicam (MOBIC) 15 MG tablet Take 15 mg by mouth daily.    [provider]  norethindrone-ethinyl estradiol-iron (MICROGESTIN FE,GILDESS FE,LOESTRIN FE) 1.5-30 MG-MCG tablet Take 1 tablet by mouth daily. 10/07/16   Tommie Samsook, Jayce G, DO  predniSONE (DELTASONE) 10 MG tablet 60 mg daily on days 1 through 5, 40 mg on day 6, 30 mg on day 7, 20 mg on day 8, 10 mg on day 9 11/13/17   Nita SickleVeronese, [place], MD    Allergies Patient has no known allergies.  Family History  Problem Relation Age of Onset  . Arthritis Unknown   . Lung cancer Unknown   . Stroke Unknown   . Hypertension Unknown   . Diabetes Unknown     Social History Social History   Tobacco Use  . Smoking status: Never Smoker  . Smokeless tobacco: Never Used  Substance Use Topics  . Alcohol use: No    Alcohol/week: 0.0 oz  . Drug use: No    Review of Systems  Constitutional: Negative for fever. Eyes: Negative for visual changes. ENT: Negative for sore throat. + tinnitus Neck: No neck pain  Cardiovascular: Negative for chest pain. Respiratory: Negative for shortness of breath. Gastrointestinal: Negative for abdominal pain, vomiting or diarrhea. Genitourinary: Negative for dysuria. Musculoskeletal: Negative for back pain. Skin: Negative for rash. Neurological: Negative for weakness or numbness. + vertigo and HA Psych: No SI or HI  ____________________________________________   PHYSICAL EXAM:  VITAL SIGNS:  ED Triage Vitals  Enc Vitals Group     BP 11/13/17 1905 110/61     Pulse Rate 11/13/17 1905 63     Resp 11/13/17 1905 18     Temp 11/13/17 1905 98.3 F (36.8 C)     Temp Source 11/13/17 1905 Oral     SpO2 11/13/17 1905 100 %     Weight 11/13/17 1905 180 lb (81.6 kg)     Height 11/13/17 1905 5\' 6"  (1.676 m)     Head Circumference --      Peak Flow --      Pain Score 11/13/17 1909 7     Pain Loc --      Pain Edu? --      Excl. in GC? --      Constitutional: Alert and oriented. Well appearing and in no apparent distress. HEENT:      Head: Normocephalic and atraumatic.         Eyes: Conjunctivae are normal. Sclera is non-icteric.       Mouth/Throat: Mucous membranes are moist.       Ears: Bilateral TMs visualized with no bulging or erythema      Neck: Supple with no signs of meningismus. Cardiovascular: Regular rate and rhythm. No murmurs, gallops, or rubs. 2+ symmetrical distal pulses are present in all extremities. No JVD. Respiratory: Normal respiratory effort. Lungs are clear to auscultation bilaterally. No wheezes, crackles, or rhonchi.  Gastrointestinal: Soft, non tender, and non distended with positive bowel sounds. No rebound or guarding. Musculoskeletal: Nontender with normal range of motion in all extremities. No edema, cyanosis, or erythema of extremities. Neurologic: Normal speech and language. A & O x3, PERRL, EOMI, no nystagmus, CN II-XII intact, motor testing reveals good tone and bulk throughout. There is no evidence of pronator drift or dysmetria. Muscle strength is 5/5 throughout. Sensory examination is intact. Gait is normal. Skin: Skin is warm, dry and intact. No rash noted. Psychiatric: Mood and affect are normal. Speech and behavior are normal.  ____________________________________________   LABS (all labs ordered are listed, but only abnormal results are displayed)  Labs Reviewed  URINALYSIS, COMPLETE (UACMP) WITH MICROSCOPIC - Abnormal; Notable for the following components:      Result Value   Color, Urine YELLOW (*)    APPearance CLEAR (*)    All other components within normal limits  BASIC METABOLIC PANEL  CBC  TROPONIN I  POC URINE PREG, ED  POCT PREGNANCY, URINE   ____________________________________________  EKG  ED ECG REPORT I, Nita Sickle, the attending physician, personally viewed and interpreted this ECG.  Normal sinus rhythm, normal intervals, normal axis, no STE or  depressions, no evidence of HOCM, AV block, delta wave, ARVD, prolonged QTc, WPW, or Brugada.  ____________________________________________  RADIOLOGY  none  ____________________________________________   PROCEDURES  Procedure(s) performed: None Procedures Critical Care performed:  None ____________________________________________   INITIAL IMPRESSION / ASSESSMENT AND PLAN / ED COURSE  32 y.o. female with a history of labyrinthitis who presents for evaluation of vertigo, tinnitus and HA x 5 days.  Patient is extremely well-appearing, no distress, normal vital signs, completely neurologically intact with no acute cerebellar findings.  Her symptoms are concerning for labyrinthitis.  Patient also complaining of a headache.  She was given meclizine, Benadryl, fluids, Reglan, Toradol with full resolution of her symptoms.  Her labs were all within normal limits and showed no acute findings.  Her EKG showed no evidence of ischemia or dysrhythmia.  Patient was discharged  home on a prednisone taper, meclizine, and referred to ENT since this is her second episode.      As part of my medical decision making, I reviewed the following data within the electronic MEDICAL RECORD NUMBER Nursing notes reviewed and incorporated, Labs reviewed , EKG interpreted , Old chart reviewed, Notes from prior ED visits and [place] Controlled Substance Database    Pertinent labs & imaging results that were available during my care of the patient were reviewed by me and considered in my medical decision making (see chart for details).    ____________________________________________   FINAL CLINICAL IMPRESSION(S) / ED DIAGNOSES  Final diagnoses:  Labyrinthitis, unspecified laterality      NEW MEDICATIONS STARTED DURING THIS VISIT:  ED Discharge Orders        Ordered    meclizine (ANTIVERT) 25 MG tablet  3 times daily PRN,   Status:  Discontinued     11/13/17 2127    predniSONE (DELTASONE) 20 MG tablet  Daily,    Status:  Discontinued     11/13/17 2127    predniSONE (DELTASONE) 10 MG tablet     11/13/17 2129    meclizine (ANTIVERT) 25 MG tablet  3 times daily PRN     11/13/17 2130       Note:  This document was prepared using Dragon voice recognition software and may include unintentional dictation errors.    Don Perking, [place], MD 11/13/17 2140

## 2017-11-13 NOTE — ED Notes (Signed)
Pt c/o dizziness that started last Saturday and has progressively worsened. Pt also c/o severe headache and blurry vision.
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
## 2018-02-18 ENCOUNTER — Inpatient Hospital Stay: Admission: RE | Admit: 2018-02-18 | Payer: BLUE CROSS/BLUE SHIELD | Source: Ambulatory Visit

## 2018-02-18 ENCOUNTER — Other Ambulatory Visit: Payer: Self-pay | Admitting: Specialist

## 2018-02-19 ENCOUNTER — Other Ambulatory Visit: Payer: Self-pay | Admitting: Specialist

## 2018-02-23 ENCOUNTER — Other Ambulatory Visit: Payer: Self-pay

## 2018-02-23 ENCOUNTER — Encounter
Admission: RE | Admit: 2018-02-23 | Discharge: 2018-02-23 | Disposition: A | Discharge status: Home / Self Care | Payer: BLUE CROSS/BLUE SHIELD | Source: Ambulatory Visit | Attending: Specialist | Admitting: Specialist

## 2018-02-23 HISTORY — DX: Anemia, unspecified: D64.9

## 2018-02-23 NOTE — Patient Instructions (Signed)
Your procedure is scheduled on: 03-02-18 Report to Same Day Surgery 2nd floor medical mall [facility](Medical Mall Entrance-take elevator on left to 2nd floor.  Check in with surgery information desk.) To find out your arrival time please call [phone] between 1PM - 3PM on 02-27-18  Remember: Instructions that are not followed completely may result in serious medical risk, up to and including death, or upon the discretion of your surgeon and anesthesiologist your surgery may need to be rescheduled.    _x___ 1. Do not eat food after midnight the night before your procedure. You may drink clear liquids up to 2 hours before you are scheduled to arrive at the hospital for your procedure.  Do not drink clear liquids within 2 hours of your scheduled arrival to the hospital.  Clear liquids include  --Water or Apple juice without pulp  --Clear carbohydrate beverage such as ClearFast or Gatorade  --Black Coffee or Clear Tea (No milk, no creamers, do not add anything to the coffee or Tea   ____Ensure clear carbohydrate drink on the way to the hospital for bariatric patients  ____Ensure clear carbohydrate drink 3 hours before surgery for Dr Rutherford NailByrnett's patients if physician instructed.   No gum chewing or hard candies.     __x__ 2. No Alcohol for 24 hours before or after surgery.   __x__3. No Smoking or e-cigarettes for 24 prior to surgery.  Do not use any chewable tobacco products for at least 6 hour prior to surgery   ____  4. Bring all medications with you on the day of surgery if instructed.    __x__ 5. Notify your doctor if there is any change in your medical condition     (cold, fever, infections).    x___6. On the morning of surgery brush your teeth with toothpaste and water.  You may rinse your mouth with mouth wash if you wish.  Do not swallow any toothpaste or mouthwash.   Do not wear jewelry, make-up, hairpins, clips or nail polish.  Do not wear lotions, powders, or perfumes. You may wear  deodorant.  Do not shave 48 hours prior to surgery. Men may shave face and neck.  Do not bring valuables to the hospital.    [facility] is not responsible for any belongings or valuables.               Contacts, dentures or bridgework may not be worn into surgery.  Leave your suitcase in the car. After surgery it may be brought to your room.  For patients admitted to the hospital, discharge time is determined by your  treatment team.  _  Patients discharged the day of surgery will not be allowed to drive home.  You will need someone to drive you home and stay with you the night of your procedure.    Please read over the following fact sheets that you were given:   [facility] Preparing for Surgery and or MRSA Information   ____ Take anti-hypertensive listed below, cardiac, seizure, asthma, anti-reflux and psychiatric medicines. These include:  1. NONE  2.  3.  4.  5.  6.  ____Fleets enema or Magnesium Citrate as directed.   ____ Use CHG Soap or sage wipes as directed on instruction sheet   ____ Use inhalers on the day of surgery and bring to hospital day of surgery  ____ Stop Metformin and Janumet 2 days prior to surgery.    ____ Take 1/2 of usual insulin dose the night  before surgery and none on the morning surgery.   ____ Follow recommendations from Cardiologist, Pulmonologist or PCP regarding stopping Aspirin, Coumadin, Plavix ,Eliquis, Effient, or Pradaxa, and Pletal.  X____Stop Anti-inflammatories such as Advil, Aleve, Ibuprofen, Motrin, Naproxen, Naprosyn, Goodies powders or aspirin products NOW-OK to take Tylenol    _x___ Stop supplements until after surgery-STOP MELATONIN NOW-MAY RESUME AFTER SURGERY   ____ Bring C-Pap to the hospital.

## 2018-02-24 ENCOUNTER — Inpatient Hospital Stay: Admission: RE | Admit: 2018-02-24 | Payer: BLUE CROSS/BLUE SHIELD | Source: Ambulatory Visit

## 2018-03-01 MED ORDER — CEFAZOLIN SODIUM-DEXTROSE 2-4 GM/100ML-% IV SOLN
2.0000 g | INTRAVENOUS | Status: AC
  Administered 2018-03-02: 2 g via INTRAVENOUS

## 2018-03-01 MED ORDER — CLINDAMYCIN PHOSPHATE 600 MG/50ML IV SOLN
600.0000 mg | INTRAVENOUS | Status: AC
  Administered 2018-03-02: 900 mg via INTRAVENOUS

## 2018-03-02 ENCOUNTER — Encounter: Payer: Self-pay | Admitting: *Deleted

## 2018-03-02 ENCOUNTER — Encounter
Admission: RE | Disposition: A | Discharge status: Home / Self Care | Payer: Self-pay | Source: Ambulatory Visit | Attending: Specialist

## 2018-03-02 ENCOUNTER — Ambulatory Visit
Admission: RE | Admit: 2018-03-02 | Discharge: 2018-03-02 | Disposition: A | Discharge status: Home / Self Care | Payer: BLUE CROSS/BLUE SHIELD | Source: Ambulatory Visit | Attending: Specialist | Admitting: Specialist

## 2018-03-02 ENCOUNTER — Other Ambulatory Visit: Payer: Self-pay

## 2018-03-02 ENCOUNTER — Ambulatory Visit: Payer: BLUE CROSS/BLUE SHIELD | Admitting: Anesthesiology

## 2018-03-02 DIAGNOSIS — M67431 Ganglion, right wrist: Secondary | ICD-10-CM | POA: Diagnosis not present

## 2018-03-02 DIAGNOSIS — Z91013 Allergy to seafood: Secondary | ICD-10-CM | POA: Insufficient documentation

## 2018-03-02 DIAGNOSIS — Z79899 Other long term (current) drug therapy: Secondary | ICD-10-CM | POA: Insufficient documentation

## 2018-03-02 DIAGNOSIS — J45909 Unspecified asthma, uncomplicated: Secondary | ICD-10-CM | POA: Insufficient documentation

## 2018-03-02 HISTORY — PX: GANGLION CYST EXCISION: SHX1691

## 2018-03-02 LAB — POCT PREGNANCY, URINE: Preg Test, Ur: NEGATIVE

## 2018-03-02 SURGERY — EXCISION, GANGLION CYST, WRIST
Anesthesia: General | Site: Wrist | Laterality: Right

## 2018-03-02 MED ORDER — LACTATED RINGERS IV SOLN
INTRAVENOUS | Status: DC
  Administered 2018-03-02: 11:00:00 via INTRAVENOUS

## 2018-03-02 MED ORDER — DEXAMETHASONE SODIUM PHOSPHATE 10 MG/ML IJ SOLN
INTRAMUSCULAR | Status: AC
  Filled 2018-03-02: qty 1

## 2018-03-02 MED ORDER — MELOXICAM 15 MG PO TABS: 15.0000 mg | ORAL_TABLET | Freq: Every day | ORAL | 3 refills | Status: DC

## 2018-03-02 MED ORDER — FAMOTIDINE 20 MG PO TABS
20.0000 mg | ORAL_TABLET | Freq: Once | ORAL | Status: AC
  Administered 2018-03-02: 20 mg via ORAL

## 2018-03-02 MED ORDER — LIDOCAINE HCL (PF) 2 % IJ SOLN
INTRAMUSCULAR | Status: AC
  Filled 2018-03-02: qty 10

## 2018-03-02 MED ORDER — FENTANYL CITRATE (PF) 100 MCG/2ML IJ SOLN
INTRAMUSCULAR | Status: DC | PRN
  Administered 2018-03-02 (×2): 25 ug via INTRAVENOUS

## 2018-03-02 MED ORDER — PROPOFOL 10 MG/ML IV BOLUS
INTRAVENOUS | Status: DC | PRN
  Administered 2018-03-02: 150 mg via INTRAVENOUS

## 2018-03-02 MED ORDER — LIDOCAINE HCL (CARDIAC) PF 100 MG/5ML IV SOSY
PREFILLED_SYRINGE | INTRAVENOUS | Status: DC | PRN
  Administered 2018-03-02: 60 mg via INTRAVENOUS

## 2018-03-02 MED ORDER — HYDROCODONE-ACETAMINOPHEN 5-325 MG PO TABS: 1.0000 | ORAL_TABLET | Freq: Four times a day (QID) | ORAL | 0 refills | Status: DC | PRN

## 2018-03-02 MED ORDER — ONDANSETRON HCL 4 MG/2ML IJ SOLN
INTRAMUSCULAR | Status: DC | PRN
  Administered 2018-03-02: 4 mg via INTRAVENOUS

## 2018-03-02 MED ORDER — HYDROCODONE-ACETAMINOPHEN 5-325 MG PO TABS
1.0000 | ORAL_TABLET | Freq: Four times a day (QID) | ORAL | Status: DC | PRN
  Administered 2018-03-02: 1 via ORAL

## 2018-03-02 MED ORDER — GABAPENTIN 300 MG PO CAPS
300.0000 mg | ORAL_CAPSULE | ORAL | Status: AC
  Administered 2018-03-02: 300 mg via ORAL

## 2018-03-02 MED ORDER — CLINDAMYCIN PHOSPHATE 600 MG/50ML IV SOLN
INTRAVENOUS | Status: AC
  Filled 2018-03-02: qty 50

## 2018-03-02 MED ORDER — GABAPENTIN 400 MG PO CAPS: 400.0000 mg | ORAL_CAPSULE | Freq: Three times a day (TID) | ORAL | 3 refills | Status: DC

## 2018-03-02 MED ORDER — CEFAZOLIN SODIUM-DEXTROSE 2-4 GM/100ML-% IV SOLN
INTRAVENOUS | Status: AC
  Filled 2018-03-02: qty 100

## 2018-03-02 MED ORDER — PROMETHAZINE HCL 25 MG/ML IJ SOLN: 6.2500 mg | INTRAMUSCULAR | Status: DC | PRN

## 2018-03-02 MED ORDER — DEXAMETHASONE SODIUM PHOSPHATE 10 MG/ML IJ SOLN
INTRAMUSCULAR | Status: DC | PRN
  Administered 2018-03-02: 10 mg via INTRAVENOUS

## 2018-03-02 MED ORDER — ONDANSETRON HCL 4 MG/2ML IJ SOLN
INTRAMUSCULAR | Status: AC
  Filled 2018-03-02: qty 2

## 2018-03-02 MED ORDER — FAMOTIDINE 20 MG PO TABS
ORAL_TABLET | ORAL | Status: AC
  Administered 2018-03-02: 20 mg via ORAL
  Filled 2018-03-02: qty 1

## 2018-03-02 MED ORDER — MIDAZOLAM HCL 2 MG/2ML IJ SOLN
INTRAMUSCULAR | Status: DC | PRN
  Administered 2018-03-02: 2 mg via INTRAVENOUS

## 2018-03-02 MED ORDER — CHLORHEXIDINE GLUCONATE CLOTH 2 % EX PADS: 6.0000 | MEDICATED_PAD | Freq: Once | CUTANEOUS | Status: DC

## 2018-03-02 MED ORDER — KETAMINE HCL 50 MG/ML IJ SOLN
INTRAMUSCULAR | Status: DC | PRN
  Administered 2018-03-02: 50 mg via INTRAMUSCULAR

## 2018-03-02 MED ORDER — HYDROCODONE-ACETAMINOPHEN 5-325 MG PO TABS
ORAL_TABLET | ORAL | Status: AC
  Filled 2018-03-02: qty 1

## 2018-03-02 MED ORDER — CLINDAMYCIN PHOSPHATE 600 MG/50ML IV SOLN: 600.0000 mg | INTRAVENOUS | Status: DC

## 2018-03-02 MED ORDER — FENTANYL CITRATE (PF) 100 MCG/2ML IJ SOLN
INTRAMUSCULAR | Status: AC
  Filled 2018-03-02: qty 2

## 2018-03-02 MED ORDER — GABAPENTIN 300 MG PO CAPS
ORAL_CAPSULE | ORAL | Status: AC
  Administered 2018-03-02: 300 mg via ORAL
  Filled 2018-03-02: qty 1

## 2018-03-02 MED ORDER — FENTANYL CITRATE (PF) 100 MCG/2ML IJ SOLN
25.0000 ug | INTRAMUSCULAR | Status: DC | PRN
  Administered 2018-03-02: 50 ug via INTRAVENOUS

## 2018-03-02 MED ORDER — KETAMINE HCL 50 MG/ML IJ SOLN
INTRAMUSCULAR | Status: AC
  Filled 2018-03-02: qty 10

## 2018-03-02 MED ORDER — FENTANYL CITRATE (PF) 100 MCG/2ML IJ SOLN
INTRAMUSCULAR | Status: AC
  Administered 2018-03-02: 50 ug via INTRAVENOUS
  Filled 2018-03-02: qty 2

## 2018-03-02 MED ORDER — PROPOFOL 10 MG/ML IV BOLUS
INTRAVENOUS | Status: AC
  Filled 2018-03-02: qty 20

## 2018-03-02 MED ORDER — BUPIVACAINE HCL (PF) 0.5 % IJ SOLN
INTRAMUSCULAR | Status: DC | PRN
  Administered 2018-03-02: 10 mL

## 2018-03-02 MED ORDER — MIDAZOLAM HCL 2 MG/2ML IJ SOLN
INTRAMUSCULAR | Status: AC
  Filled 2018-03-02: qty 2

## 2018-03-02 SURGICAL SUPPLY — 33 items
BLADE SURG MINI STRL (BLADE) ×2 IMPLANT
BNDG ESMARK 4X12 TAN STRL LF (GAUZE/BANDAGES/DRESSINGS) ×2 IMPLANT
CANISTER SUCT 1200ML W/VALVE (MISCELLANEOUS) ×2 IMPLANT
CHLORAPREP W/TINT 26ML (MISCELLANEOUS) ×2 IMPLANT
CUFF TOURN 18 STER (MISCELLANEOUS) ×1 IMPLANT
DRSG GAUZE FLUFF 36X18 (GAUZE/BANDAGES/DRESSINGS) ×2 IMPLANT
ELECT REM PT RETURN 9FT ADLT (ELECTROSURGICAL) ×2
ELECTRODE REM PT RTRN 9FT ADLT (ELECTROSURGICAL) ×1 IMPLANT
GAUZE PETRO XEROFOAM 1X8 (MISCELLANEOUS) ×2 IMPLANT
GAUZE SPONGE 4X4 12PLY STRL (GAUZE/BANDAGES/DRESSINGS) ×2 IMPLANT
GLOVE SURG ORTHO 8.0 STRL STRW (GLOVE) ×2 IMPLANT
GOWN STRL REUS W/ TWL LRG LVL3 (GOWN DISPOSABLE) ×1 IMPLANT
GOWN STRL REUS W/TWL LRG LVL3 (GOWN DISPOSABLE) ×1
GOWN STRL REUS W/TWL LRG LVL4 (GOWN DISPOSABLE) ×2 IMPLANT
KIT TURNOVER KIT A (KITS) IMPLANT
NS IRRIG 500ML POUR BTL (IV SOLUTION) ×2 IMPLANT
PACK EXTREMITY ARMC (MISCELLANEOUS) ×2 IMPLANT
PAD CAST CTTN 4X4 STRL (SOFTGOODS) ×1 IMPLANT
PADDING CAST COTTON 4X4 STRL (SOFTGOODS) ×1
SPLINT CAST 1 STEP 3X12 (MISCELLANEOUS) ×2 IMPLANT
STOCKINETTE 48X4 2 PLY STRL (GAUZE/BANDAGES/DRESSINGS) ×1 IMPLANT
STOCKINETTE BIAS CUT 4 980044 (GAUZE/BANDAGES/DRESSINGS) ×2 IMPLANT
STOCKINETTE STRL 4IN 9604848 (GAUZE/BANDAGES/DRESSINGS) ×2 IMPLANT
STRAP SAFETY 5IN WIDE (MISCELLANEOUS) ×2 IMPLANT
STRIP CLOSURE SKIN 1/4X4 (GAUZE/BANDAGES/DRESSINGS) ×1 IMPLANT
SUT ETHILON 4-0 (SUTURE) ×1
SUT ETHILON 4-0 FS2 18XMFL BLK (SUTURE) ×1
SUT ETHILON 5-0 (SUTURE) ×1
SUT ETHILON 5-0 C-3 18XMFL BLK (SUTURE) ×1
SUT VIC AB 4-0 FS2 27 (SUTURE) ×2 IMPLANT
SUTURE ETHLN 4-0 FS2 18XMF BLK (SUTURE) ×1 IMPLANT
SUTURE ETHLN 5-0 C3 18XMF BLK (SUTURE) ×1 IMPLANT
SWABSTK COMLB BENZOIN TINCTURE (MISCELLANEOUS) ×1 IMPLANT

## 2018-03-02 NOTE — Transfer of Care (Signed)
Immediate Anesthesia Transfer of Care Note  Patient: Katrina Curry  Procedure(s) Performed: REMOVAL GANGLION OF WRIST (Right Wrist)  Patient Location: PACU  Anesthesia Type:General  Level of Consciousness: drowsy  Airway & Oxygen Therapy: Patient Spontanous Breathing and Patient connected to face mask oxygen  Post-op Assessment: Report given to RN and Post -op Vital signs reviewed and stable  Post vital signs: Reviewed and stable  Last Vitals:  Vitals Value Taken Time  BP 117/87 03/02/2018 11:45 AM  Temp    Pulse 66 03/02/2018 11:47 AM  Resp 20 03/02/2018 11:47 AM  SpO2 100 % 03/02/2018 11:47 AM  Vitals shown include unvalidated device data.  Last Pain:  Vitals:   03/02/18 1005  TempSrc: Temporal  PainSc: 0-No pain         Complications: No apparent anesthesia complications

## 2018-03-02 NOTE — Discharge Instructions (Signed)

## 2018-03-02 NOTE — H&P (Signed)
THE PATIENT WAS SEEN PRIOR TO SURGERY TODAY.  HISTORY, ALLERGIES, HOME MEDICATIONS AND OPERATIVE PROCEDURE WERE REVIEWED. RISKS AND BENEFITS OF SURGERY DISCUSSED WITH PATIENT AGAIN.  NO CHANGES FROM INITIAL HISTORY AND PHYSICAL NOTED.    

## 2018-03-02 NOTE — Anesthesia Procedure Notes (Signed)
Procedure Name: LMA Insertion Date/Time: 03/02/2018 10:55 AM Performed by: Dava Najjar, CRNA Pre-anesthesia Checklist: Patient identified, Emergency Drugs available, Suction available, Patient being monitored and Timeout performed Patient Re-evaluated:Patient Re-evaluated prior to induction Oxygen Delivery Method: Circle system utilized Preoxygenation: Pre-oxygenation with 100% oxygen Induction Type: IV induction LMA: LMA inserted LMA Size: 4.0 Number of attempts: 1 Placement Confirmation: positive ETCO2 and breath sounds checked- equal and bilateral Tube secured with: Tape Dental Injury: Teeth and Oropharynx as per pre-operative assessment

## 2018-03-02 NOTE — Anesthesia Preprocedure Evaluation (Signed)
Anesthesia Evaluation  Patient identified by MRN, date of birth, ID band Patient awake    Reviewed: Allergy & Precautions, H&P , NPO status , Patient's Chart, lab work & pertinent test results, reviewed documented beta blocker date and time   History of Anesthesia Complications Negative for: history of anesthetic complications  Airway Mallampati: I  TM Distance: >3 FB Neck ROM: full  Mouth opening: Limited Mouth Opening  Dental  (+) Dental Advidsory Given, Teeth Intact   Pulmonary neg shortness of breath, asthma , neg COPD, neg recent URI,           Cardiovascular Exercise Tolerance: Good negative cardio ROS       Neuro/Psych negative neurological ROS  negative psych ROS   GI/Hepatic negative GI ROS, Neg liver ROS,   Endo/Other  negative endocrine ROS  Renal/GU negative Renal ROS  negative genitourinary   Musculoskeletal   Abdominal   Peds  Hematology negative hematology ROS (+)   Anesthesia Other Findings Past Medical History: No date: Anemia No date: Asthma     Comment:  WELL CONTROLLED-NO INHALERS No date: Chickenpox   Reproductive/Obstetrics negative OB ROS                             Anesthesia Physical Anesthesia Plan  ASA: II  Anesthesia Plan: General   Post-op Pain Management:    Induction: Intravenous  PONV Risk Score and Plan: 3 and Ondansetron, Dexamethasone, Promethazine, Midazolam and Treatment may vary due to age or medical condition  Airway Management Planned: LMA  Additional Equipment:   Intra-op Plan:   Post-operative Plan: Extubation in OR  Informed Consent: I have reviewed the patients History and Physical, chart, labs and discussed the procedure including the risks, benefits and alternatives for the proposed anesthesia with the patient or authorized representative who has indicated his/her understanding and acceptance.   Dental Advisory  Given  Plan Discussed with: Anesthesiologist, CRNA and Surgeon  Anesthesia Plan Comments:         Anesthesia Quick Evaluation

## 2018-03-02 NOTE — Op Note (Signed)
03/02/2018  11:47 AM  PATIENT:  Katrina Curry    PRE-OPERATIVE DIAGNOSIS:  GANGLION CYST OF THE RIGHT DORSAL WRIST  POST-OPERATIVE DIAGNOSIS:  Same  PROCEDURE:  REMOVAL GANGLION OF WRIST  SURGEON:  Valinda Hoar, MD  ANESTHESIA:   General  TOURNIQUET TIME:  24  MIN  PREOPERATIVE INDICATIONS:  [name] is a  32 y.o. female with a diagnosis of GANGLION CYST OF THE RIGH TDORSAL WRIST who failed conservative measures and elected for surgical management.    The risks benefits and alternatives were discussed with the patient preoperatively including but not limited to the risks of infection, bleeding, nerve injury, cardiopulmonary complications, the need for revision surgery, among others, and the patient was willing to proceed.   OPERATIVE FINDINGS: Small cystic right ganglion deep at the radiocarpal joint  OPERATIVE PROCEDURE: The patient was brought to the operating room where they underwent satisfactory general LMA anesthesia.  The  arm was prepped and draped in sterile fashion.  Esmarch was applied and tourniquet inflated to 250 mmHg. .  A short transverse incision was made over the dorsal aspect of the  wrist.  Dissection was carried out carefully under loupe magnification with fine scissors.  Spring retractors were inserted.  Extensor tendons were retracted.  The dorsal capsule was swollen and there was a cystic area at the radiocarpal joint.  This was small.  This was excised.  The joint was cleaned.  The wound was irrigated.  A single 4-0 Vicryl suture was placed dorsally.  No remaining cystic tissue appeared appeared to be present.  The subcutaneous tissue was closed with 4-0 Vicryl and the skin closed with a running subcuticular 4-0 nylon and Steri-Strips.  Half percent Marcaine was placed in the wound.  Dry sterile hand dressing with volar splint was applied.  Tourniquet was deflated with good return of blood flow to the hand.  Sponge and needle counts were  correct.  The patient was awakened and taken to recovery in good condition.    Valinda Hoar, M.D.

## 2018-03-02 NOTE — Anesthesia Post-op Follow-up Note (Signed)
Anesthesia QCDR form completed.        

## 2018-03-02 NOTE — Anesthesia Postprocedure Evaluation (Signed)
Anesthesia Post Note  Patient: Katrina Curry  Procedure(s) Performed: REMOVAL GANGLION OF WRIST (Right Wrist)  Patient location during evaluation: PACU Anesthesia Type: General Level of consciousness: awake and alert Pain management: pain level controlled Vital Signs Assessment: post-procedure vital signs reviewed and stable Respiratory status: spontaneous breathing, nonlabored ventilation, respiratory function stable and patient connected to nasal cannula oxygen Cardiovascular status: blood pressure returned to baseline and stable Postop Assessment: no apparent nausea or vomiting Anesthetic complications: no     Last Vitals:  Vitals:   03/02/18 1253 03/02/18 1326  BP: (!) 103/59 (!) 111/58  Pulse: 70 64  Resp: 14 14  Temp: (!) 36.2 C   SpO2: 100% 100%    Last Pain:  Vitals:   03/02/18 1326  TempSrc:   PainSc: 0-No pain                 Lenard Simmer

## 2018-03-03 LAB — SURGICAL PATHOLOGY

## 2018-08-28 ENCOUNTER — Telehealth: Payer: Self-pay

## 2018-08-28 NOTE — Telephone Encounter (Signed)
Called and spoke with pt. Pt did go to the urgent care and was seen and evaluated. They did a pelvic exam and everything looked normal but they did get her set up to see an OBGYN on Monday for further work up to determine what is causing the bleeding that was not determined at the urgent care.  Sent to PCP as an [place]

## 2018-08-28 NOTE — Telephone Encounter (Signed)
Pt c/o bleeding since last week.  Wants to be seen urgently.  [phone]  [facility] hasn't seen pt before.  Therefore, msg forwarded to [place] to schedule as I cannot adv pt.

## 2018-08-28 NOTE — Telephone Encounter (Signed)
Called and spoke with pt to advise to go to ED due to vaginal bleeding.   Pt called [facility] on 08/26/2018 at 10:20 PM   Pt stated that her period started March 1-March 7. Has intercourse on the 9th and took the morning after pill, since the 16th of March she has had a heavy period with small blood clots, cramp like abdominal pain, no dysuria, no appatite, vomiting.   Called and spoke with pt. Pt advised to go to urgent care or ED.

## 2018-08-29 NOTE — Telephone Encounter (Signed)
Noted  

## 2018-09-21 ENCOUNTER — Ambulatory Visit: Payer: Self-pay

## 2018-09-21 DIAGNOSIS — N912 Amenorrhea, unspecified: Secondary | ICD-10-CM

## 2018-09-21 NOTE — Telephone Encounter (Signed)
Out going call to Patient who  complains of  Having her cycle from 16th thru the 30th.  Used Emergency Birth control.  At times flow was heavy at times it was light.  Patient would like a return phone call .  Patient is not bleeding now . Wonders when cycle will start. Patient states that she is under some stress.  Reports a "weird sensation in her Tummy"  Related to Patient office is closed now.  Routing encounter to office .  Patient awaits return call from Provider.      Reason for Disposition . Wants a pregnancy test done in the office  Answer Assessment - Initial Assessment Questions 1. LMP:  "When did your last menstrual period begin?"     *No Answer* 2. DAYS LATE: "How many days late is your menstrual period?"     *No Answer* 3. REGULARITY: "How regular are your periods?"    Some day  4. PREGNANCY: "Is there any chance you are pregnant?" (e.g., unprotected intercourse, missed birth control pill, broken condom) "Have you used a home pregnancy test?"     *No Answer* 5. BREASTFEEDING: "Are you breastfeeding?"     *No Answer* 6. BIRTH CONTROL PILLS: "Are you taking birth control pills, or have you stopped recently?"     *No Answer* 7. DEPOPROVERA: "Has your doctor given you a shot to prevent pregnancy?" (e.g., Depoprovera injection)     *No Answer* 8. CAUSE: "What do you think caused the missed period?" (e.g., stress, rapid weight loss, excessive exercise)     Stress 9. OTHER SYMPTOMS: "Do you have any other symptoms?" (e.g., abdominal pain)     Weird movement in tummy.  Protocols used: MENSTRUAL PERIOD - MISSED OR LATE-A-AH

## 2018-09-21 NOTE — Telephone Encounter (Signed)
Patient calling back regarding this issue. She states that the bleeding has stopped but she has some additional questions for Dr. Birdie Sons or CMA. Please advise.

## 2018-09-22 NOTE — Telephone Encounter (Signed)
Called and spoke with pt. Pt stated that she spoke with Olegario Messier who has sent Dr. Birdie Sons he questions that she had viia mychart. I asked the pt if she has an OBGYN doctor and she has found one but has not had an appt with them as of yet.   Pt is just very concern because she has never missed and period before and she she understands that all the test have came back negative but she would feel better having a blood test done just to confirm that she is not if possible.   Sent to PCP

## 2018-09-22 NOTE — Telephone Encounter (Signed)
Emergency Birthcontrol on March 9/20, bleeding started on 16+ and stopped on 30th.  Patient went to [facility] when the bleeding did not stop.  Now wants to know when she should expect her cycle to start back.  And should she get a blood test for pregnancy? Patient has taken 10 OTC pregnancy test all have comeback negative.

## 2018-09-22 NOTE — Telephone Encounter (Signed)
I would have expected her cycle to start as it normally would have after her prior cycle started, though could be delayed with the duration of her most recent cycle depending on the cause. I would suggest a blood pregnancy test to rule that out and then if she does not have her cycle we will need to refer to GYN for evaluation. Order placed.

## 2018-09-22 NOTE — Telephone Encounter (Signed)
Patient notified and lab scheduled.

## 2018-09-22 NOTE — Telephone Encounter (Signed)
See other phone note

## 2018-09-23 ENCOUNTER — Other Ambulatory Visit (INDEPENDENT_AMBULATORY_CARE_PROVIDER_SITE_OTHER): Payer: BLUE CROSS/BLUE SHIELD

## 2018-09-23 ENCOUNTER — Other Ambulatory Visit: Payer: Self-pay

## 2018-09-23 DIAGNOSIS — N912 Amenorrhea, unspecified: Secondary | ICD-10-CM

## 2018-09-23 LAB — HCG, QUANTITATIVE, PREGNANCY: Quantitative HCG: <0.6 m[IU]/mL

## 2018-09-23 NOTE — Telephone Encounter (Signed)
Scheduled

## 2018-09-24 ENCOUNTER — Telehealth: Payer: Self-pay

## 2018-09-24 NOTE — Telephone Encounter (Signed)
Patient already received the call with her results from [place].  Katrina Curry

## 2018-09-24 NOTE — Telephone Encounter (Signed)
Copied from CRM [id number]. Topic: General - Inquiry >> Sep 24, 2018  9:47 AM Baldo Daub L wrote: Reason for CRM:  Pt calling to find out if her lab results are back.  She can be reached at [phone]

## 2018-10-05 ENCOUNTER — Ambulatory Visit (INDEPENDENT_AMBULATORY_CARE_PROVIDER_SITE_OTHER): Payer: BLUE CROSS/BLUE SHIELD | Admitting: Family Medicine

## 2018-10-05 ENCOUNTER — Other Ambulatory Visit: Payer: Self-pay

## 2018-10-05 ENCOUNTER — Encounter: Payer: Self-pay | Admitting: Family Medicine

## 2018-10-05 DIAGNOSIS — N912 Amenorrhea, unspecified: Secondary | ICD-10-CM

## 2018-10-05 DIAGNOSIS — M25531 Pain in right wrist: Secondary | ICD-10-CM

## 2018-10-05 DIAGNOSIS — N926 Irregular menstruation, unspecified: Secondary | ICD-10-CM | POA: Diagnosis not present

## 2018-10-05 NOTE — Assessment & Plan Note (Signed)
Continues to have issues with this following surgery.  Advised her to follow the directions of her orthopedic surgeon and if not improving contact them for follow-up.  I advised if needed we could refer her to a new orthopedic surgeon in [facility] if she needs to be seen after she has moved.

## 2018-10-05 NOTE — Assessment & Plan Note (Addendum)
Patient with period of amenorrhea following a prolonged menstrual cycle after taking the morning-after pill.  She had a negative blood hCG test.  Her increased bleeding is likely related to the use of emergency contraception.  Discussed referral to GYN to help confirm this.  She is also due for a Pap smear and can have this done through GYN.  She is moving to [facility] and thus we will refer her to GYN there.

## 2018-10-05 NOTE — Progress Notes (Signed)
Virtual Visit via video note  This visit type was conducted due to national recommendations for restrictions regarding the COVID-19 pandemic (e.g. social distancing).  This format is felt to be most appropriate for this patient at this time.  All issues noted in this document were discussed and addressed.  No physical exam was performed (except for noted visual exam findings with Video Visits).   I connected with  on 10/05/18 at  1:45 PM EDT by a video enabled telemedicine application and verified that I am speaking with the correct person using two identifiers. Location patient: home Location provider: work Persons participating in the virtual visit: patient, provider  I discussed the limitations, risks, security and privacy concerns of performing an evaluation and management service by telephone and the availability of in person appointments. I also discussed with the patient that there may be a patient responsible charge related to this service. The patient expressed understanding and agreed to proceed.   Reason for visit: Follow-up.  HPI: Amenorrhea: Patient notes she had a menstrual cycle on March 1 through March 7.  She had intercourse and took the morning after pill on March 9.  She developed bleeding on March 16 with heavy menses and cramping.  She went to urgent care and was evaluated.  She noted they did a pelvic exam and noted everything looked normal but they did get her set up to see OB/GYN though she had not seen them at this time.  She notes she had a menstrual cycle for almost 3 weeks after using the morning-after pill.  She had some cramping with this.  She notes her bleeding stopped and she went without a cycle for over 1 month.  She notes she did have a normal menstrual cycle starting on April 25.  She notes typically her menstrual cycles last 7 days and occur monthly.  She does have occasional abdominal cramping with these.  No dysuria, discharge, fever, vomiting, or diarrhea.  She  reports she is moving to [facility] in June.  Right wrist pain: Patient did have surgery for this.  This was for removal of a ganglion cyst.  She does note she continues to have some discomfort.  She contacted her orthopedist last month and advised him of her symptoms.  She notes she was advised to wear a brace and take ibuprofen.  She notes if it does not improve she is going to contact them for follow-up.   ROS: See pertinent positives and negatives per HPI.  Past Medical History:  Diagnosis Date  . Anemia   . Asthma    WELL CONTROLLED-NO INHALERS  . Chickenpox     Past Surgical History:  Procedure Laterality Date  . CESAREAN SECTION    . GANGLION CYST EXCISION Right 03/02/2018   Procedure: REMOVAL GANGLION OF WRIST;  Surgeon: Deeann Saint, MD;  Location: [facility] ORS;  Service: Orthopedics;  Laterality: Right;  . NO PAST SURGERIES      Family History  Problem Relation Age of Onset  . Arthritis Unknown   . Lung cancer Unknown   . Stroke Unknown   . Hypertension Unknown   . Diabetes Unknown     SOCIAL HX: Non-smoker.   Current Outpatient Medications:  .  Ascorbic Acid (VITAMIN C) 1000 MG tablet, Take 1,000 mg by mouth daily., Disp: , Rfl:  .  Cyanocobalamin (B-12 PO), Take 1 tablet by mouth daily., Disp: , Rfl:  .  OVER THE COUNTER MEDICATION, Take 1 tablet by mouth as needed (  MAG 07)., Disp: , Rfl:   EXAM:  VITALS per patient if applicable: None.  GENERAL: alert, oriented, appears well and in no acute distress  HEENT: atraumatic, conjunttiva clear, no obvious abnormalities on inspection of external nose and ears  NECK: normal movements of the head and neck  LUNGS: on inspection no signs of respiratory distress, breathing rate appears normal, no obvious gross SOB, gasping or wheezing  CV: no obvious cyanosis  MS: moves all visible extremities without noticeable abnormality  PSYCH/NEURO: pleasant and cooperative, no obvious depression or anxiety, speech and  thought processing grossly intact  ASSESSMENT AND PLAN:  Discussed the following assessment and plan:  Irregular menses - Plan: Ambulatory referral to Gynecology  Amenorrhea  Wrist pain, right  Amenorrhea Patient with period of amenorrhea following a prolonged menstrual cycle after taking the morning-after pill.  She had a negative blood hCG test.  Her increased bleeding is likely related to the use of emergency contraception.  Discussed referral to GYN to help confirm this.  She is also due for a Pap smear and can have this done through GYN.  [place] [place] [place] and thus we will refer her to GYN there.  Wrist pain, right Continues to have issues with this following surgery.  Advised her to follow the directions of her orthopedic surgeon and if not improving contact them for follow-up.  I advised if needed we could refer her to a new [place]opedic [place] in [place] [place] [facility] if she needs to be seen after she has moved.  Social distancing precautions and sick precautions given regarding COVID-19.   I discussed the assessment and treatment plan with the patient. The patient was provided an opportunity to ask questions and all were answered. The patient agreed with the plan and demonstrated an understanding of the instructions.   The patient was advised to call back or seek an in-person evaluation if the symptoms worsen or if the condition fails to improve as anticipated.   Marikay [name] Shalom Mcguiness, MD

## 2018-11-20 ENCOUNTER — Telehealth: Payer: Self-pay

## 2018-11-20 NOTE — Telephone Encounter (Signed)
Copied from [place] [id number]. Topic: General - Other >> Nov 20, 2018 11:12 AM Leward Quan A wrote: Reason for CRM: Patient called to say that she had an Asthma attack on 11/19/2018 and need to be prescribed an inhaler please. Asking for Rx to be sent to the pharmacy.  Ph# [phone]

## 2018-11-20 NOTE — Telephone Encounter (Signed)
Called and spoke to patient.  Patient had an asthma attack on yesterday (11/19/18) and stated that she used her son's ProAir inhaler that was prescribed to him by his pediatrician.  Pt said that she needed something so she used his inhaler.  Pt said that she has a history of asthma but has not had an attack in about 2 years.  Patient said that she previously was prescribed becotide and ventoline a few years ago when she lived in [place].  Patient said that she would also have to take prednisone if it got too bad.   Patient said that she also has been having panic attacks but has not seen a physician to address those symptoms and currently is not taking any medications for them.  Patient denied having any asthmatic symptoms; no shortness of breath today.  Pt requested PCP to call in inhaler.  Informed pt that provider would most likely want patient to schedule an appt since pt has not seen PCP to address those symptoms and to further evaluate both asthma and panic attacks.    There are no available appointments available with PCP today.  All other providers in the office are also booked.  Scheduled pt with next available appointment with PCP on Monday 11/23/18 at 11:00 am.  Advised patient to go to Urgent Care if symptoms worsen.  Informed pt that notes will be forwarded to PCP to see if he decides to call in an Rx for her to have over the weekend before her scheduled appt on Monday.

## 2018-11-20 NOTE — Telephone Encounter (Signed)
We will need to complete the visit with the patient to determine appropriate treatment.  Plan to see patient as scheduled on Monday.

## 2018-11-23 ENCOUNTER — Other Ambulatory Visit: Payer: Self-pay

## 2018-11-23 ENCOUNTER — Telehealth: Payer: Self-pay | Admitting: Family Medicine

## 2018-11-23 ENCOUNTER — Ambulatory Visit (INDEPENDENT_AMBULATORY_CARE_PROVIDER_SITE_OTHER): Payer: BLUE CROSS/BLUE SHIELD | Admitting: Family Medicine

## 2018-11-23 ENCOUNTER — Encounter: Payer: Self-pay | Admitting: Family Medicine

## 2018-11-23 DIAGNOSIS — J45909 Unspecified asthma, uncomplicated: Secondary | ICD-10-CM | POA: Insufficient documentation

## 2018-11-23 DIAGNOSIS — F419 Anxiety disorder, unspecified: Secondary | ICD-10-CM

## 2018-11-23 DIAGNOSIS — F329 Major depressive disorder, single episode, unspecified: Secondary | ICD-10-CM | POA: Diagnosis not present

## 2018-11-23 DIAGNOSIS — J4520 Mild intermittent asthma, uncomplicated: Secondary | ICD-10-CM | POA: Diagnosis not present

## 2018-11-23 DIAGNOSIS — F32A Depression, unspecified: Secondary | ICD-10-CM | POA: Insufficient documentation

## 2018-11-23 MED ORDER — ALBUTEROL SULFATE HFA 108 (90 BASE) MCG/ACT IN AERS: 2.0000 | INHALATION_SPRAY | Freq: Four times a day (QID) | RESPIRATORY_TRACT | 0 refills | Status: AC | PRN

## 2018-11-23 MED ORDER — QVAR REDIHALER 40 MCG/ACT IN AERB: 1.0000 | INHALATION_SPRAY | Freq: Two times a day (BID) | RESPIRATORY_TRACT | 1 refills | Status: DC

## 2018-11-23 NOTE — Progress Notes (Signed)
Virtual Visit via video Note  This visit type was conducted due to national recommendations for restrictions regarding the COVID-19 pandemic (e.g. social distancing).  This format is felt to be most appropriate for this patient at this time.  All issues noted in this document were discussed and addressed.  No physical exam was performed (except for noted visual exam findings with Video Visits).   I connected with Katrina Curry today at 11:00 AM EDT by a video enabled telemedicine application and verified that I am speaking with the correct person using two identifiers. Location patient: home Location provider: work Persons participating in the virtual visit: patient, provider  I discussed the limitations, risks, security and privacy concerns of performing an evaluation and management service by telephone and the availability of in person appointments. I also discussed with the patient that there may be a patient responsible charge related to this service. The patient expressed understanding and agreed to proceed.  Reason for visit: Follow-up.  HPI: Anxiety/panic attacks: Patient notes she has had quite a bit of anxiety surrounding the COVID-19 pandemic, moving to a new city, and with all the issues surrounding Greggory StallionGeorge Floyd's death.  She worries frequently about getting sick given her history of asthma.  She does note having some panic attacks with one recently last week where she was evaluated by EMS and refused ED evaluation.  She suddenly felt weak overall and felt like everything stopped in her throat.  She was hyperventilating.  She felt terrified.  She was able to calm down after being evaluated by EMS and has not had any recurrent symptoms.  She does note some sadness and depression.  No SI.  Asthma: Patient has a long history of asthma.  She reports numerous episodes of requiring oral steroids and hospitalization prior to moving to the [place].  She notes no recent wheezing, cough,  shortness of breath, or fevers.  No COVID-19 exposure.  No travel.  She notes her inhalers have expired.  She would rarely use pro-air.  She was also on inhaled beclomethasone seasonally.  She would get these prescriptions when she went back home.  She notes she would typically only have an asthma attack if it was too hot or too cold or if she had a panic attack.   ROS: See pertinent positives and negatives per HPI.  Past Medical History:  Diagnosis Date   Anemia    Asthma    WELL CONTROLLED-NO INHALERS   Chickenpox     Past Surgical History:  Procedure Laterality Date   CESAREAN SECTION     GANGLION CYST EXCISION Right 03/02/2018   Procedure: REMOVAL GANGLION OF WRIST;  Surgeon: Deeann SaintMiller, Howard, MD;  Location: [facility] ORS;  Service: Orthopedics;  Laterality: Right;   NO PAST SURGERIES      Family History  Problem Relation Age of Onset   Arthritis Unknown    Lung cancer Unknown    Stroke Unknown    Hypertension Unknown    Diabetes Unknown     SOCIAL HX: Non-smoker   Current Outpatient Medications:    Ascorbic Acid (VITAMIN C) 1000 MG tablet, Take 1,000 mg by mouth daily., Disp: , Rfl:    Cyanocobalamin (B-12 PO), Take 1 tablet by mouth daily., Disp: , Rfl:    OVER THE COUNTER MEDICATION, Take 1 tablet by mouth as needed (MAG 07)., Disp: , Rfl:    albuterol (VENTOLIN HFA) 108 (90 Base) MCG/ACT inhaler, Inhale 2 puffs into the lungs every 6 (six) hours as  needed for wheezing or shortness of breath., Disp: 18 g, Rfl: 0   beclomethasone (QVAR REDIHALER) 40 MCG/ACT inhaler, Inhale 1 puff into the lungs 2 (two) times daily. Use seasonally for asthma, Disp: 10.6 g, Rfl: 1  EXAM:  VITALS per patient if applicable: None.  GENERAL: alert, oriented, appears well and in no acute distress  HEENT: atraumatic, conjunttiva clear, no obvious abnormalities on inspection of external nose and ears  NECK: normal movements of the head and neck  LUNGS: on inspection no signs of  respiratory distress, breathing rate appears normal, no obvious gross SOB, gasping or wheezing  CV: no obvious cyanosis  MS: moves all visible extremities without noticeable abnormality  PSYCH/NEURO: pleasant and cooperative, speech and thought processing grossly intact  ASSESSMENT AND PLAN:  Discussed the following assessment and plan:  Anxiety and depression Patient with symptoms of anxiety and depression.  I discussed potential treatment options including medication and therapy though the patient declined these.  She notes that she is starting to feel better with this as her siblings and parents are calling her to check on her more frequently.  I discussed that adding in exercise could be beneficial as well and she will try to do this in a manner which she feels safe with.  She has moved to a different town in [place] and is scheduled with GYN to establish care and notes she will start looking for PCP after she sees GYN in about a month.  I encouraged her to contact us if she is unable to establish with a PCP within the next 3 months so we can do follow-up on these issues.  Asthma This is a new issue to me.  Currently asymptomatic.  We will refill her albuterol to use as needed.  We will also refill beclomethasone for her to use seasonally.  Discussed if she has any issues with her asthma she should contact us.  Social distancing precautions and sick precautions given regarding COVID-19.   I discussed the assessment and treatment plan with the patient. The patient was provided an opportunity to ask questions and all were answered. The patient agreed with the plan and demonstrated an understanding of the instructions.   The patient was advised to call back or seek an in-person evaluation if the symptoms worsen or if the condition fails to improve as anticipated.    Tommi Rumps, MD

## 2018-11-23 NOTE — Assessment & Plan Note (Addendum)
Patient with symptoms of anxiety and depression.  I discussed potential treatment options including medication and therapy though the patient declined these.  She notes that she is starting to feel better with this as her siblings and parents are calling her to check on her more frequently.  I discussed that adding in exercise could be beneficial as well and she will try to do this in a manner which she feels safe with.  She has moved to a different town in [place] and is scheduled with GYN to establish care and notes she will start looking for PCP after she sees GYN in about a month.  I encouraged her to contact us if she is unable to establish with a PCP within the next 3 months so we can do follow-up on these issues.

## 2018-11-23 NOTE — Telephone Encounter (Signed)
Patient calling and states that this inhaler is too expensive. Would like to know if another inhaler could be sent to the pharmacy in place of this one? Please advise.  [facility] [place].

## 2018-11-23 NOTE — Assessment & Plan Note (Addendum)
This is a new issue to me.  Currently asymptomatic.  We will refill her albuterol to use as needed.  We will also refill beclomethasone for her to use seasonally.  Discussed if she has any issues with her asthma she should contact us.

## 2018-11-25 NOTE — Telephone Encounter (Signed)
Patient is calling checking on status of medication alternative. Please advise.

## 2018-11-27 NOTE — Telephone Encounter (Signed)
Is this for the qvar or the albuterol?

## 2018-11-27 NOTE — Telephone Encounter (Signed)
Its the QVar sorry!

## 2018-11-27 NOTE — Telephone Encounter (Signed)
Patient needs alternative for inhaler please advise.

## 2018-11-29 MED ORDER — BUDESONIDE-FORMOTEROL FUMARATE 80-4.5 MCG/ACT IN AERO: 2.0000 | INHALATION_SPRAY | Freq: Two times a day (BID) | RESPIRATORY_TRACT | 0 refills | Status: AC

## 2018-11-29 NOTE — Telephone Encounter (Signed)
Symbicort sent to the pharmacy.  If this is not affordable we will have to check with her pharmacy to determine what is preferred by her insurance.  Thanks.

## 2018-11-30 NOTE — Telephone Encounter (Signed)
Pt stated that Symbicort was in the same $200 range as previous inhaler. Please advise.

## 2018-11-30 NOTE — Telephone Encounter (Signed)
Patient notified and voiced understanding.

## 2018-12-01 NOTE — Telephone Encounter (Signed)
Noted. I will await her call back.

## 2018-12-01 NOTE — Telephone Encounter (Signed)
Patient will check with her insurance carrier to see what medication is covered and notify office.

## 2018-12-04 NOTE — Telephone Encounter (Signed)
Pt calling states that pharmacy can not give her costs of different inhalers unless it has been RXd to her and then they can tell her how much it will cost.
